# Patient Record
Sex: Female | Born: 1955 | Race: White | Hispanic: No | Marital: Single | Smoking: Never smoker
Health system: Southern US, Community
[De-identification: ages and names within clinical notes are randomized; demographics above are authoritative.]

## PROBLEM LIST (undated history)

## (undated) DIAGNOSIS — K219 Gastro-esophageal reflux disease without esophagitis: Secondary | ICD-10-CM

## (undated) HISTORY — PX: OTHER SURGICAL HISTORY: SHX169

## (undated) HISTORY — PX: OVARIAN CYST REMOVAL: SHX89

## (undated) HISTORY — PX: LAPAROSCOPY: SHX197

---

## 2015-05-16 ENCOUNTER — Other Ambulatory Visit: Payer: Self-pay

## 2015-05-16 ENCOUNTER — Other Ambulatory Visit: Payer: Self-pay | Admitting: Family Medicine

## 2015-05-16 DIAGNOSIS — R319 Hematuria, unspecified: Secondary | ICD-10-CM

## 2015-05-16 DIAGNOSIS — M545 Low back pain: Secondary | ICD-10-CM

## 2015-05-18 ENCOUNTER — Ambulatory Visit
Admission: RE | Admit: 2015-05-18 | Discharge: 2015-05-18 | Disposition: A | Discharge status: Home / Self Care | Payer: 59 | Source: Ambulatory Visit | Attending: Family Medicine | Admitting: Family Medicine

## 2015-05-18 DIAGNOSIS — R319 Hematuria, unspecified: Secondary | ICD-10-CM

## 2015-05-18 DIAGNOSIS — M545 Low back pain: Secondary | ICD-10-CM

## 2015-09-08 ENCOUNTER — Inpatient Hospital Stay (HOSPITAL_COMMUNITY): Payer: 59

## 2015-09-08 ENCOUNTER — Encounter (HOSPITAL_COMMUNITY): Payer: Self-pay | Admitting: *Deleted

## 2015-09-08 ENCOUNTER — Inpatient Hospital Stay (HOSPITAL_COMMUNITY)
Admission: AD | Admit: 2015-09-08 | Discharge: 2015-09-10 | DRG: 699 | Disposition: A | Discharge status: Home / Self Care | Payer: 59 | Source: Ambulatory Visit | Attending: Obstetrics & Gynecology | Admitting: Obstetrics & Gynecology

## 2015-09-08 DIAGNOSIS — K219 Gastro-esophageal reflux disease without esophagitis: Secondary | ICD-10-CM | POA: Diagnosis present

## 2015-09-08 DIAGNOSIS — S3720XA Unspecified injury of bladder, initial encounter: Secondary | ICD-10-CM | POA: Diagnosis present

## 2015-09-08 DIAGNOSIS — L0390 Cellulitis, unspecified: Secondary | ICD-10-CM | POA: Diagnosis present

## 2015-09-08 DIAGNOSIS — N9971 Accidental puncture and laceration of a genitourinary system organ or structure during a genitourinary system procedure: Secondary | ICD-10-CM | POA: Diagnosis present

## 2015-09-08 DIAGNOSIS — S3729XA Other injury of bladder, initial encounter: Principal | ICD-10-CM | POA: Diagnosis present

## 2015-09-08 DIAGNOSIS — Z87898 Personal history of other specified conditions: Secondary | ICD-10-CM

## 2015-09-08 DIAGNOSIS — R319 Hematuria, unspecified: Secondary | ICD-10-CM

## 2015-09-08 DIAGNOSIS — G8918 Other acute postprocedural pain: Secondary | ICD-10-CM

## 2015-09-08 HISTORY — DX: Gastro-esophageal reflux disease without esophagitis: K21.9

## 2015-09-08 LAB — COMPREHENSIVE METABOLIC PANEL
ALBUMIN: 4.6 g/dL (ref 3.5–5.0)
ALBUMIN: 3.2 g/dL — AB (ref 3.5–5.0)
ALT: 22 U/L (ref 14–54)
ALT: 18 U/L (ref 14–54)
ANION GAP: 11 (ref 5–15)
AST: 29 U/L (ref 15–41)
AST: 24 U/L (ref 15–41)
Alkaline Phosphatase: 95 U/L (ref 38–126)
Alkaline Phosphatase: 69 U/L (ref 38–126)
Anion gap: 21 — ABNORMAL HIGH (ref 5–15)
BILIRUBIN TOTAL: 1.1 mg/dL (ref 0.3–1.2)
BUN: 86 mg/dL — AB (ref 6–20)
BUN: 49 mg/dL — ABNORMAL HIGH (ref 6–20)
CALCIUM: 7.4 mg/dL — AB (ref 8.9–10.3)
CHLORIDE: 88 mmol/L — AB (ref 101–111)
CO2: 11 mmol/L — AB (ref 22–32)
CO2: 15 mmol/L — AB (ref 22–32)
Calcium: 7.1 mg/dL — ABNORMAL LOW (ref 8.9–10.3)
Chloride: 108 mmol/L (ref 101–111)
Creatinine, Ser: 9.49 mg/dL — ABNORMAL HIGH (ref 0.44–1.00)
Creatinine, Ser: 2.98 mg/dL — ABNORMAL HIGH (ref 0.44–1.00)
GFR calc Af Amer: 5 mL/min — ABNORMAL LOW (ref >60)
GFR calc non Af Amer: 16 mL/min — ABNORMAL LOW (ref >60)
GFR, EST AFRICAN AMERICAN: 19 mL/min — AB (ref >60)
GFR, EST NON AFRICAN AMERICAN: 4 mL/min — AB (ref >60)
GLUCOSE: 116 mg/dL — AB (ref 65–99)
GLUCOSE: 144 mg/dL — AB (ref 65–99)
POTASSIUM: 4.7 mmol/L (ref 3.5–5.1)
POTASSIUM: 3.1 mmol/L — AB (ref 3.5–5.1)
SODIUM: 120 mmol/L — AB (ref 135–145)
SODIUM: 134 mmol/L — AB (ref 135–145)
TOTAL PROTEIN: 5.9 g/dL — AB (ref 6.5–8.1)
Total Bilirubin: 0.7 mg/dL (ref 0.3–1.2)
Total Protein: 8.7 g/dL — ABNORMAL HIGH (ref 6.5–8.1)

## 2015-09-08 LAB — CBC
HCT: 39.9 % (ref 36.0–46.0)
Hemoglobin: 14.5 g/dL (ref 12.0–15.0)
MCH: 30.3 pg (ref 26.0–34.0)
MCHC: 36.3 g/dL — ABNORMAL HIGH (ref 30.0–36.0)
MCV: 83.5 fL (ref 78.0–100.0)
PLATELETS: 437 10*3/uL — AB (ref 150–400)
RBC: 4.78 MIL/uL (ref 3.87–5.11)
RDW: 12.7 % (ref 11.5–15.5)
WBC: 10.9 10*3/uL — AB (ref 4.0–10.5)

## 2015-09-08 MED ORDER — SODIUM CHLORIDE 0.9 % IV SOLN
3.0000 g | Freq: Two times a day (BID) | INTRAVENOUS | Status: DC
  Administered 2015-09-09: 3 g via INTRAVENOUS
  Filled 2015-09-08 (×2): qty 3

## 2015-09-08 MED ORDER — SODIUM CHLORIDE 0.9 % IV SOLN
3.0000 g | Freq: Four times a day (QID) | INTRAVENOUS | Status: DC
  Administered 2015-09-08: 3 g via INTRAVENOUS
  Filled 2015-09-08 (×3): qty 3

## 2015-09-08 MED ORDER — IOHEXOL 300 MG/ML  SOLN
50.0000 mL | Freq: Once | INTRAMUSCULAR | Status: AC | PRN
  Administered 2015-09-08: 50 mL via URETHRAL

## 2015-09-08 MED ORDER — SODIUM CHLORIDE 0.9 % IV SOLN
INTRAVENOUS | Status: DC
  Administered 2015-09-08: 12:00:00 via INTRAVENOUS

## 2015-09-08 MED ORDER — SODIUM CHLORIDE 0.9 % IV SOLN
INTRAVENOUS | Status: DC
  Administered 2015-09-08 – 2015-09-09 (×4): via INTRAVENOUS

## 2015-09-08 NOTE — Consult Note (Signed)
Consult: Extraperitoneal bladder perforation Requested by: Dr. 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 Per Dr. Vincente Poli, the dissection was straight forward but specimen extraction was a bit difficult and there was suspicion of a bladder injury. The patient noted progressively more fluid coming from her umbilical and lower abdominal incisions as the week went by and less urine output. A BUN and creatinine were checked earlier today and noted to be significantly elevated at 86 and 9.49. She underwent a CT abdomen and pelvis without contrast which showed normal kidneys. I was able to trace both ureters to the bladder. A CT cystogram revealed a small (6-7 mm) extraperitoneal bladder perforation with extravasation of contrast toward the subcutaneous tissues and incisions. There was also a small amount of ascites in the abdomen. I reviewed the images.  Currently, pt feeling better. Happy to get something to eat. She has some erythema of the mons and is on Unasyn.   Past Medical History  Diagnosis Date  . GERD (gastroesophageal reflux disease)    Past Surgical History  Procedure Laterality Date  . Ovarian cyst removal    . Left salpingoophorectomy     . Laparoscopy      Home Medications:  Prescriptions prior to admission  Medication Sig Dispense Refill Last Dose  . acetaminophen (TYLENOL) 500 MG tablet Take 1,000 mg by mouth every 6 (six) hours as needed for mild pain or moderate pain.   09/08/2015 at Unknown time   Allergies:  Allergies  Allergen Reactions  . Aspirin Hives    Family History  Problem Relation Age of Onset  . Heart disease Father    Social History:  reports that she has never smoked. She does not have any smokeless tobacco history on file. She reports that she does not drink alcohol or use illicit drugs.  ROS: A complete review of systems was performed.  All systems are negative  except for pertinent findings as noted. ROS   Physical Exam:  Vital signs in last 24 hours: Temp:  [97.5 F (36.4 C)] 97.5 F (36.4 C) (02/23 1530) Pulse Rate:  [86] 86 (02/23 1530) Resp:  [18] 18 (02/23 1530) BP: (136)/(80) 136/80 mmHg (02/23 1530) SpO2:  [100 %] 100 % (02/23 1530) Weight:  [66.679 kg (147 lb)] 66.679 kg (147 lb) (02/23 1530)    Intake/Output Summary (Last 24 hours) at 09/08/15 1800 Last data filed at 09/08/15 1748  Gross per 24 hour  Intake 1157.5 ml  Output   3800 ml  Net -2642.5 ml  another 1 L in urine bag now.   General:  Alert and oriented, No acute distress. Nurse was chaperone. Pt sitting on edge of bed eating.  HEENT: Normocephalic, atraumatic Lungs: Regular rate and effort Abdomen: Soft, nontender, nondistended, no abdominal masses. Some erythema of mons.  Back: No CVA tenderness Extremities: No edema Neurologic: Grossly intact Ext: SCD's in place   Laboratory Data:  Results for orders placed or performed during the hospital encounter of 09/08/15 (from the past 24 hour(s))  CBC     Status: Abnormal   Collection Time: 09/08/15 11:16 AM  Result Value Ref Range   WBC 10.9 (H) 4.0 - 10.5 K/uL   RBC 4.78 3.87 - 5.11 MIL/uL   Hemoglobin 14.5 12.0 - 15.0 g/dL   HCT 40.9 81.1 - 91.4 %   MCV 83.5 78.0 - 100.0 fL   MCH 30.3 26.0 - 34.0 pg  MCHC 36.3 (H) 30.0 - 36.0 g/dL   RDW 40.9 81.1 - 91.4 %   Platelets 437 (H) 150 - 400 K/uL  Comprehensive metabolic panel     Status: Abnormal   Collection Time: 09/08/15 11:16 AM  Result Value Ref Range   Sodium 120 (L) 135 - 145 mmol/L   Potassium 4.7 3.5 - 5.1 mmol/L   Chloride 88 (L) 101 - 111 mmol/L   CO2 11 (L) 22 - 32 mmol/L   Glucose, Bld 116 (H) 65 - 99 mg/dL   BUN 86 (H) 6 - 20 mg/dL   Creatinine, Ser 7.82 (H) 0.44 - 1.00 mg/dL   Calcium 7.1 (L) 8.9 - 10.3 mg/dL   Total Protein 8.7 (H) 6.5 - 8.1 g/dL   Albumin 4.6 3.5 - 5.0 g/dL   AST 29 15 - 41 U/L   ALT 22 14 - 54 U/L   Alkaline  Phosphatase 95 38 - 126 U/L   Total Bilirubin 0.7 0.3 - 1.2 mg/dL   GFR calc non Af Amer 4 (L) >60 mL/min   GFR calc Af Amer 5 (L) >60 mL/min   Anion gap 21 (H) 5 - 15   No results found for this or any previous visit (from the past 240 hour(s)). Creatinine:  Recent Labs  09/08/15 1116  CREATININE 9.49*     Impression/Assessment/plan: -extraperitoneal bladder perforation-excellent urine output after Foley catheter placed. Now what about 5 L. I suspect she will diurese and do well with Foley catheter drainage. She has a 14Fr foley. It is draining well, but if there are any issues will need to be changed to at least 16Fr. There was no specific fluid collection in the subcutaneous tissues, so hopefully the extravasated urine seen on CT will resorb. Do not suspect ureteral injury given appearance of the kidneys and ureters on the CT, description of operative findings by Dr. Vincente Poli, and nature of pt presentation, but will follow Cr and UOP. Patient on broad-spectrum antibiotics. Follow cellulitis. Follow daily BUN and creatinine. Would ultimately send home on a nightly antibiotic such as cephalexin or Bactrim DS with foley for at least 2 weeks and f/u for cystogram in my office.  Sena Clouatre 09/08/2015, 5:55 PM

## 2015-09-08 NOTE — Progress Notes (Signed)
ABD pads changed on incisions, ice applied to area

## 2015-09-08 NOTE — H&P (Signed)
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 Post op course complicated by drainage of fluid from umbilicus starting on POD #3.  She was seen on Monday in the office and diagnosed with possible seroma. She was given Keflex and advised to follow up on Wednesday. I saw her on Wednesday and she reported moderate amount of drainage from her "belly button". She reported some diarrhea. She also reported she was eating and drinking very little. Denied nausea, vomiting, fever or chills. Denied hematuria on Wednesday.  I placed iodoform gauze in her umbilical incision and advised her to come to office today. Today, she reports feeling worse.  Drainage still persistent. She reports she is urinating but small amounts.  I had her urinate in the office and there is gross hematuria which she has not noticed until now.  Given my concern for urinary tract injury, I sent her to [facility] for evaluation.  Past Medical History: Unremarkable  Past Surgical History: Above  Meds: Keflex  Allergies NKDA  General alert and oriented appears to be in some distress Lung CTAB Car RRR Abdomen slightly distended, tender along left flank No CVAT Umbilical incision - packing removed and less serosanguinous fluid noted today but still some drainage.  Low transverse incision - with probing with Qtip I can get slightly bloodier / watery drainage  Mons pubis is swollen and erythematous Results for orders placed or performed during the hospital encounter of 09/08/15 (from the past 24 hour(s))  CBC     Status: Abnormal   Collection Time: 09/08/15 11:16 AM  Result Value Ref Range   WBC 10.9 (H) 4.0 - 10.5 K/uL   RBC 4.78 3.87 - 5.11 MIL/uL   Hemoglobin 14.5 12.0 - 15.0 g/dL   HCT 16.1 09.6 - 04.5 %   MCV 83.5 78.0 - 100.0 fL   MCH 30.3 26.0 - 34.0 pg   MCHC 36.3 (H) 30.0 - 36.0 g/dL   RDW 40.9 81.1 - 91.4 %   Platelets 437 (H) 150 - 400 K/uL  Comprehensive  metabolic panel     Status: Abnormal   Collection Time: 09/08/15 11:16 AM  Result Value Ref Range   Sodium 120 (L) 135 - 145 mmol/L   Potassium 4.7 3.5 - 5.1 mmol/L   Chloride 88 (L) 101 - 111 mmol/L   CO2 11 (L) 22 - 32 mmol/L   Glucose, Bld 116 (H) 65 - 99 mg/dL   BUN 86 (H) 6 - 20 mg/dL   Creatinine, Ser 7.82 (H) 0.44 - 1.00 mg/dL   Calcium 7.1 (L) 8.9 - 10.3 mg/dL   Total Protein 8.7 (H) 6.5 - 8.1 g/dL   Albumin 4.6 3.5 - 5.0 g/dL   AST 29 15 - 41 U/L   ALT 22 14 - 54 U/L   Alkaline Phosphatase 95 38 - 126 U/L   Total Bilirubin 0.7 0.3 - 1.2 mg/dL   GFR calc non Af Amer 4 (L) >60 mL/min   GFR calc Af Amer 5 (L) >60 mL/min   Anion gap 21 (H) 5 - 15    Impression:  POD #6 Probable Lower Urinary Tract Injury  PLAN: Consulted with Dr. Mena Goes - Urology Recommend CT Cystogramm and CT Abd and Pelvis with IV contrast and delayed imaging IV Fluids Insert Foley now Discussed with patient and her brother possibility of bladder defect and wait for imaging results and urology recommendations All questions answered

## 2015-09-08 NOTE — MAU Provider Note (Signed)
History     CSN: [id number]  Arrival date and time: 09/08/15 1057   First Provider Initiated Contact with Patient 09/08/15 1153      Chief Complaint  Patient presents with  . Hematuria  . Post-op Problem   HPI Ms. Laura Mays is a 60 y.o. female who is 6 day post-op after a left salpingectomy, oophorectomy and removal of a large dermoid cyst presents to MAU today with complaint of hematuria. The patient was sent by Dr. Thana Ates from the office for further evaluation of a possible post operative complication involving the bladder. The patient was unaware of hematuria, but had hematuria on UA in the office. She states that she has had significant clear drainage from her incision sites. She complains of LLQ abdominal pain. She was taking Percocet post-operatively for pain, but states that it caused hallucinations. She also feels that Ibuprofen causes GI upset, so she is currently taking Tylenol only for pain. She denies fever.    OB History    Gravida Para Term Preterm AB TAB SAB Ectopic Multiple Living        Past Medical History  Diagnosis Date  . GERD (gastroesophageal reflux disease)     Past Surgical History  Procedure Laterality Date  . Ovarian cyst removal      No family history on file.  Social History  Substance Use Topics  . Smoking status: Never Smoker   . Smokeless tobacco: Not on file  . Alcohol Use: No    Allergies:  Allergies  Allergen Reactions  . Aspirin Hives    Prescriptions prior to admission  Medication Sig Dispense Refill Last Dose  . acetaminophen (TYLENOL) 500 MG tablet Take 1,000 mg by mouth every 6 (six) hours as needed for mild pain or moderate pain.   09/08/2015 at Unknown time    Review of Systems  Constitutional: Negative for fever and malaise/fatigue.  Gastrointestinal: Positive for nausea and abdominal pain. Negative for vomiting, diarrhea and constipation.  Genitourinary: Positive for hematuria. Negative for  dysuria.   Physical Exam   There were no vitals taken for this visit.  Physical Exam  Nursing note and vitals reviewed. Constitutional: She is oriented to person, place, and time. She appears well-developed and well-nourished. No distress.  HENT:  Head: Normocephalic and atraumatic.  Cardiovascular: Normal rate.   Respiratory: Effort normal.  GI: Soft. She exhibits no distension and no mass. There is no tenderness. There is no rebound and no guarding.  Incision are healing, but continue to drain clear fluid  Neurological: She is alert and oriented to person, place, and time.  Skin: Skin is warm and dry. There is erythema (significant erythema noted over the mons pubis and lower abdomen).  Psychiatric: She has a normal mood and affect.    Results for orders placed or performed during the hospital encounter of 09/08/15 (from the past 24 hour(s))  CBC     Status: Abnormal   Collection Time: 09/08/15 11:16 AM  Result Value Ref Range   WBC 10.9 (H) 4.0 - 10.5 K/uL   RBC 4.78 3.87 - 5.11 MIL/uL   Hemoglobin 14.5 12.0 - 15.0 g/dL   HCT 65.7 84.6 - 96.2 %   MCV 83.5 78.0 - 100.0 fL   MCH 30.3 26.0 - 34.0 pg   MCHC 36.3 (H) 30.0 - 36.0 g/dL   RDW 95.2 84.1 - 32.4 %   Platelets 437 (H) 150 - 400 K/uL  Comprehensive metabolic panel     Status: Abnormal   Collection Time: 09/08/15 11:16 AM  Result Value Ref Range   Sodium 120 (L) 135 - 145 mmol/L   Potassium 4.7 3.5 - 5.1 mmol/L   Chloride 88 (L) 101 - 111 mmol/L   CO2 11 (L) 22 - 32 mmol/L   Glucose, Bld 116 (H) 65 - 99 mg/dL   BUN 86 (H) 6 - 20 mg/dL   Creatinine, Ser 1.61 (H) 0.44 - 1.00 mg/dL   Calcium 7.1 (L) 8.9 - 10.3 mg/dL   Total Protein 8.7 (H) 6.5 - 8.1 g/dL   Albumin 4.6 3.5 - 5.0 g/dL   AST 29 15 - 41 U/L   ALT 22 14 - 54 U/L   Alkaline Phosphatase 95 38 - 126 U/L   Total Bilirubin 0.7 0.3 - 1.2 mg/dL   GFR calc non Af Amer 4 (L) >60 mL/min   GFR calc Af Amer 5 (L) >60 mL/min   Anion gap 21 (H) 5 - 15   No  results found.   MAU Course  Procedures None  MDM Dr. Thana Ates called ahead of patient arrival after seeing patient in her office Requests CBC, CMP, catheter placement, CT abdomen/pelvis and Cystogram as well as IV fluid access Catheter placed and hematuria noted. Patient refuses anti-emetics at this time CMP shows evidence of acute renal failure. Dr. Thana Ates has consulted urology. Urologist still advises CT abdomen/pelvis without contrast and cystogram.  Dr. Thana Ates has been to MAU to evaluate the patient. She states that the Urologist will also come to evaluate the patient.  Patient's urine is clearer now and she reports improvement in pain Discussed CT results with Dr. Thana Ates. She has consulted with Urology. Admit patient, Unasyn, clear liquids, repeat CBC, CMP in the morning. Urology will come see the patient on the floor.  Assessment and Plan  A: Post operative complication Hematuria Bladder injury   P: Admit to [facility] Continue IV fluids and foley catheter Unasyn IV   Marny Lowenstein, PA-C  09/08/2015, 1:53 PM

## 2015-09-09 LAB — COMPREHENSIVE METABOLIC PANEL
ALT: 15 U/L (ref 14–54)
ANION GAP: 7 (ref 5–15)
AST: 22 U/L (ref 15–41)
Albumin: 3.1 g/dL — ABNORMAL LOW (ref 3.5–5.0)
Alkaline Phosphatase: 64 U/L (ref 38–126)
BILIRUBIN TOTAL: 1 mg/dL (ref 0.3–1.2)
BUN: 24 mg/dL — AB (ref 6–20)
CALCIUM: 8.1 mg/dL — AB (ref 8.9–10.3)
CHLORIDE: 112 mmol/L — AB (ref 101–111)
CO2: 19 mmol/L — ABNORMAL LOW (ref 22–32)
Creatinine, Ser: 0.96 mg/dL (ref 0.44–1.00)
GFR calc Af Amer: >60 mL/min (ref >60)
Glucose, Bld: 114 mg/dL — ABNORMAL HIGH (ref 65–99)
POTASSIUM: 3.2 mmol/L — AB (ref 3.5–5.1)
Sodium: 138 mmol/L (ref 135–145)
Total Protein: 6.1 g/dL — ABNORMAL LOW (ref 6.5–8.1)

## 2015-09-09 LAB — CBC WITH DIFFERENTIAL/PLATELET
BASOS PCT: 0 %
Basophils Absolute: 0 10*3/uL (ref 0.0–0.1)
EOS PCT: 0 %
Eosinophils Absolute: 0 10*3/uL (ref 0.0–0.7)
HEMATOCRIT: 32.1 % — AB (ref 36.0–46.0)
Hemoglobin: 11.4 g/dL — ABNORMAL LOW (ref 12.0–15.0)
LYMPHS PCT: 25 %
Lymphs Abs: 2 10*3/uL (ref 0.7–4.0)
MCH: 29.8 pg (ref 26.0–34.0)
MCHC: 35.5 g/dL (ref 30.0–36.0)
MCV: 84 fL (ref 78.0–100.0)
MONO ABS: 0.4 10*3/uL (ref 0.1–1.0)
MONOS PCT: 5 %
NEUTROS ABS: 5.6 10*3/uL (ref 1.7–7.7)
Neutrophils Relative %: 70 %
PLATELETS: 352 10*3/uL (ref 150–400)
RBC: 3.82 MIL/uL — ABNORMAL LOW (ref 3.87–5.11)
RDW: 13 % (ref 11.5–15.5)
WBC: 8 10*3/uL (ref 4.0–10.5)

## 2015-09-09 MED ORDER — SODIUM CHLORIDE 0.9 % IV SOLN
3.0000 g | Freq: Four times a day (QID) | INTRAVENOUS | Status: DC
  Administered 2015-09-09 – 2015-09-10 (×4): 3 g via INTRAVENOUS
  Filled 2015-09-09 (×6): qty 3

## 2015-09-09 MED ORDER — GUAIFENESIN ER 600 MG PO TB12
600.0000 mg | ORAL_TABLET | Freq: Two times a day (BID) | ORAL | Status: DC | PRN
  Filled 2015-09-09: qty 1

## 2015-09-09 NOTE — Progress Notes (Signed)
This morning, Patient looks great !. Denies any pain.  Reports good appetite. Ate 80% regular breakfast. Catheter draining over 6 L since yesterday.  BP 113/72 mmHg  Pulse 78  Temp(Src) 98.6 F (37 C) (Oral)  Resp 18  Ht  (1.499 m)  Wt 66.679 kg (147 lb)  BMI 29.67 kg/m2  SpO2 99% Results for orders placed or performed during the hospital encounter of 09/08/15 (from the past 24 hour(s))  CBC     Status: Abnormal   Collection Time: 09/08/15 11:16 AM  Result Value Ref Range   WBC 10.9 (H) 4.0 - 10.5 K/uL   RBC 4.78 3.87 - 5.11 MIL/uL   Hemoglobin 14.5 12.0 - 15.0 g/dL   HCT 16.1 09.6 - 04.5 %   MCV 83.5 78.0 - 100.0 fL   MCH 30.3 26.0 - 34.0 pg   MCHC 36.3 (H) 30.0 - 36.0 g/dL   RDW 40.9 81.1 - 91.4 %   Platelets 437 (H) 150 - 400 K/uL  Comprehensive metabolic panel     Status: Abnormal   Collection Time: 09/08/15 11:16 AM  Result Value Ref Range   Sodium 120 (L) 135 - 145 mmol/L   Potassium 4.7 3.5 - 5.1 mmol/L   Chloride 88 (L) 101 - 111 mmol/L   CO2 11 (L) 22 - 32 mmol/L   Glucose, Bld 116 (H) 65 - 99 mg/dL   BUN 86 (H) 6 - 20 mg/dL   Creatinine, Ser 7.82 (H) 0.44 - 1.00 mg/dL   Calcium 7.1 (L) 8.9 - 10.3 mg/dL   Total Protein 8.7 (H) 6.5 - 8.1 g/dL   Albumin 4.6 3.5 - 5.0 g/dL   AST 29 15 - 41 U/L   ALT 22 14 - 54 U/L   Alkaline Phosphatase 95 38 - 126 U/L   Total Bilirubin 0.7 0.3 - 1.2 mg/dL   GFR calc non Af Amer 4 (L) >60 mL/min   GFR calc Af Amer 5 (L) >60 mL/min   Anion gap 21 (H) 5 - 15  Comprehensive metabolic panel     Status: Abnormal   Collection Time: 09/08/15  8:34 PM  Result Value Ref Range   Sodium 134 (L) 135 - 145 mmol/L   Potassium 3.1 (L) 3.5 - 5.1 mmol/L   Chloride 108 101 - 111 mmol/L   CO2 15 (L) 22 - 32 mmol/L   Glucose, Bld 144 (H) 65 - 99 mg/dL   BUN 49 (H) 6 - 20 mg/dL   Creatinine, Ser 9.56 (H) 0.44 - 1.00 mg/dL   Calcium 7.4 (L) 8.9 - 10.3 mg/dL   Total Protein 5.9 (L) 6.5 - 8.1 g/dL   Albumin 3.2 (L) 3.5 - 5.0 g/dL   AST  24 15 - 41 U/L   ALT 18 14 - 54 U/L   Alkaline Phosphatase 69 38 - 126 U/L   Total Bilirubin 1.1 0.3 - 1.2 mg/dL   GFR calc non Af Amer 16 (L) >60 mL/min   GFR calc Af Amer 19 (L) >60 mL/min   Anion gap 11 5 - 15  Comprehensive metabolic panel     Status: Abnormal   Collection Time: 09/09/15  5:39 AM  Result Value Ref Range   Sodium 138 135 - 145 mmol/L   Potassium 3.2 (L) 3.5 - 5.1 mmol/L   Chloride 112 (H) 101 - 111 mmol/L   CO2 19 (L) 22 - 32 mmol/L   Glucose, Bld 114 (H) 65 - 99 mg/dL   BUN 24 (  H) 6 - 20 mg/dL   Creatinine, Ser 1.61 0.44 - 1.00 mg/dL   Calcium 8.1 (L) 8.9 - 10.3 mg/dL   Total Protein 6.1 (L) 6.5 - 8.1 g/dL   Albumin 3.1 (L) 3.5 - 5.0 g/dL   AST 22 15 - 41 U/L   ALT 15 14 - 54 U/L   Alkaline Phosphatase 64 38 - 126 U/L   Total Bilirubin 1.0 0.3 - 1.2 mg/dL   GFR calc non Af Amer >60 >60 mL/min   GFR calc Af Amer >60 >60 mL/min   Anion gap 7 5 - 15  CBC WITH DIFFERENTIAL     Status: Abnormal   Collection Time: 09/09/15  5:39 AM  Result Value Ref Range   WBC 8.0 4.0 - 10.5 K/uL   RBC 3.82 (L) 3.87 - 5.11 MIL/uL   Hemoglobin 11.4 (L) 12.0 - 15.0 g/dL   HCT 09.6 (L) 04.5 - 40.9 %   MCV 84.0 78.0 - 100.0 fL   MCH 29.8 26.0 - 34.0 pg   MCHC 35.5 30.0 - 36.0 g/dL   RDW 81.1 91.4 - 78.2 %   Platelets 352 150 - 400 K/uL   Neutrophils Relative % 70 %   Neutro Abs 5.6 1.7 - 7.7 K/uL   Lymphocytes Relative 25 %   Lymphs Abs 2.0 0.7 - 4.0 K/uL   Monocytes Relative 5 %   Monocytes Absolute 0.4 0.1 - 1.0 K/uL   Eosinophils Relative 0 %   Eosinophils Absolute 0.0 0.0 - 0.7 K/uL   Basophils Relative 0 %   Basophils Absolute 0.0 0.0 - 0.1 K/uL   Abdomen  Non distended today. No drainage from incisions . Closed infraumbilical incision with Dermabond. Erythema on mons is still present but decreased and soft.  IMPRESSION: POD #7 Bladder rupture  PLAN:: Patient doing well. Continue Foley Continue Unasyn Catheter in for 2 weeks Appreciate Urology  consult

## 2015-09-09 NOTE — Clinical Documentation Improvement (Signed)
GYN and/or Urology  (Query responses must be documented in the current medical record, not on the CDI BPA form in [facility].)  Please document if a condition below provides greater specificity regarding the lab value of Na+ 120 mmol/L.  Possible Clinical Conditions:  - Hyponatremia  - Other condition  - Unable to clinically determine  Clinical Information: Patient has received IV NS at 125 ml/hr this admission Na+ trend this admission Component     Latest Ref Rng 09/08/2015 09/08/2015 09/09/2015        11:16 AM  8:34 PM   Sodium     135 - 145 mmol/L 120 (L) 134 (L) 138     Please exercise your independent, professional judgment when responding. A specific answer is not anticipated or expected.   Thank You, Jerral Ralph  RN BSN CCDS [phone] Health Information Management [place]

## 2015-09-09 NOTE — Discharge Instructions (Signed)
Foley Catheter Care, Adult    A Foley catheter is a soft, flexible tube. This tube is placed into your bladder to drain pee (urine). If you go home with this catheter in place, follow the instructions below.  TAKING CARE OF THE CATHETER  1. Wash your hands with soap and water.  2. Put soap and water on a clean washcloth.  ¨ Clean the skin where the tube goes into your body.  § Clean away from the tube site.  § Never wipe toward the tube.  § Clean the area using a circular motion.  ¨ Remove all the soap. Pat the area dry with a clean towel. For males, reposition the skin that covers the end of the penis (foreskin).  3. Attach the tube to your leg with tape or a leg strap. Do not stretch the tube tight. If you are using tape, remove any stickiness left behind by past tape you used.  4. Keep the drainage bag below your hips. Keep it off the floor.  5. Check your tube during the day. Make sure it is working and draining. Make sure the tube does not curl, twist, or bend.  6. Do not pull on the tube or try to take it out.  TAKING CARE OF THE DRAINAGE BAGS    You will have a large overnight drainage bag and a small leg bag. You may wear the overnight bag any time. Never wear the small bag at night. Follow the directions below.  Emptying the Drainage Bag  Empty your drainage bag when it is ?-½ full or at least 2-3 times a day.  1. Wash your hands with soap and water.  2. Keep the drainage bag below your hips.  3. Hold the dirty bag over the toilet or clean container.  4. Open the pour spout at the bottom of the bag. Empty the pee into the toilet or container. Do not let the pour spout touch anything.  5. Clean the pour spout with a gauze pad or cotton ball that has rubbing alcohol on it.  6. Close the pour spout.  7. Attach the bag to your leg with tape or a leg strap.  8. Wash your hands well.  Changing the Drainage Bag  Change your bag once a month or sooner if it starts to smell or look dirty.   1. Wash your hands with  soap and water.  2. Pinch the rubber tube so that pee does not spill out.  3. Disconnect the catheter tube from the drainage tube at the connection valve. Do not let the tubes touch anything.  4. Clean the end of the catheter tube with an alcohol wipe. Clean the end of a the drainage tube with a different alcohol wipe.  5. Connect the catheter tube to the drainage tube of the clean drainage bag.  6. Attach the new bag to the leg with tape or a leg strap. Avoid attaching the new bag too tightly.  7. Wash your hands well.  Cleaning the Drainage Bag  1. Wash your hands with soap and water.  2. Wash the bag in warm, soapy water.  3. Rinse the bag with warm water.  4. Fill the bag with a mixture of white vinegar and water (1 cup vinegar to 1 quart warm water [.2 liter vinegar to 1 liter warm water]). Close the bag and soak it for 30 minutes in the solution.  5. Rinse the bag with warm water.  6.   Hang the bag to dry with the pour spout open and hanging downward.  7. Store the clean bag (once it is dry) in a clean plastic bag.  8. Wash your hands well.  PREVENT INFECTION  · Wash your hands before and after touching your tube.  · Take showers every day. Wash the skin where the tube enters your body. Do not take baths. Replace wet leg straps with dry ones, if this applies.  · Do not use powders, sprays, or lotions on the genital area. Only use creams, lotions, or ointments as told by your doctor.  · For females, wipe from front to back after going to the bathroom.  · Drink enough fluids to keep your pee clear or pale yellow unless you are told not to have too much fluid (fluid restriction).  · Do not let the drainage bag or tubing touch or lie on the floor.  · Wear cotton underwear to keep the area dry.  GET HELP IF:  · Your pee is cloudy or smells unusually bad.  · Your tube becomes clogged.  · You are not draining pee into the bag or your bladder feels full.  · Your tube starts to leak.  GET HELP RIGHT AWAY IF:  · You have  pain, puffiness (swelling), redness, or yellowish-white fluid (pus) where the tube enters the body.  · You have pain in the belly (abdomen), legs, lower back, or bladder.  · You have a fever.  · You see blood fill the tube, or your pee is pink or red.  · You feel sick to your stomach (nauseous), throw up (vomit), or have chills.  · Your tube gets pulled out.  MAKE SURE YOU:   · Understand these instructions.  · Will watch your condition.  · Will get help right away if you are not doing well or get worse.     This information is not intended to replace advice given to you by your health care provider. Make sure you discuss any questions you have with your health care provider.     Document Released: 10/27/2012 Document Revised: 07/23/2014 Document Reviewed: 10/27/2012  Elsevier Interactive Patient Education ©2016 [employer]   

## 2015-09-09 NOTE — Progress Notes (Signed)
   Patient reports she feels much better.  She had a brisk diuresis with return of normal BUN and creatinine.  Filed Vitals:   09/09/15 0504 09/09/15 1000  BP: 113/72 111/71  Pulse: 78 81  Temp: 98.6 F (37 C) 98.5 F (36.9 C)  Resp: 18 18    Intake/Output Summary (Last 24 hours) at 09/09/15 1422 Last data filed at 09/09/15 1036  Gross per 24 hour  Intake 3712.5 ml  Output   4600 ml  Net -887.5 ml   NAD GU - foley in place, light hematuria - red urine in bag, more pink/light in tubing.   A/p - extraperitoneal bladder perforation-continue Foley catheter.  I will arrange a cystogram week of Mar 13. Keep on one nightly antibiotic such as cephalexin or Bactrim DS.  Gross hematuria-hematuria is very light without clots and should not be clinically significant.  If issue might need a bigger catheter.  Discussed with patient the importance of catheter drainage and if it is not draining to call office immediately.

## 2015-09-10 LAB — COMPREHENSIVE METABOLIC PANEL
ALK PHOS: 59 U/L (ref 38–126)
ALT: 17 U/L (ref 14–54)
ANION GAP: 7 (ref 5–15)
AST: 23 U/L (ref 15–41)
Albumin: 3 g/dL — ABNORMAL LOW (ref 3.5–5.0)
BILIRUBIN TOTAL: 0.9 mg/dL (ref 0.3–1.2)
BUN: 6 mg/dL (ref 6–20)
CALCIUM: 8.2 mg/dL — AB (ref 8.9–10.3)
CO2: 20 mmol/L — ABNORMAL LOW (ref 22–32)
CREATININE: 0.47 mg/dL (ref 0.44–1.00)
Chloride: 112 mmol/L — ABNORMAL HIGH (ref 101–111)
GFR calc Af Amer: >60 mL/min (ref >60)
Glucose, Bld: 103 mg/dL — ABNORMAL HIGH (ref 65–99)
POTASSIUM: 3 mmol/L — AB (ref 3.5–5.1)
Sodium: 139 mmol/L (ref 135–145)
TOTAL PROTEIN: 5.5 g/dL — AB (ref 6.5–8.1)

## 2015-09-10 LAB — CBC
HEMATOCRIT: 29.8 % — AB (ref 36.0–46.0)
Hemoglobin: 10.6 g/dL — ABNORMAL LOW (ref 12.0–15.0)
MCH: 29.7 pg (ref 26.0–34.0)
MCHC: 35.6 g/dL (ref 30.0–36.0)
MCV: 83.5 fL (ref 78.0–100.0)
Platelets: 363 10*3/uL (ref 150–400)
RBC: 3.57 MIL/uL — ABNORMAL LOW (ref 3.87–5.11)
RDW: 13.4 % (ref 11.5–15.5)
WBC: 8.7 10*3/uL (ref 4.0–10.5)

## 2015-09-10 MED ORDER — SULFAMETHOXAZOLE-TRIMETHOPRIM 400-80 MG PO TABS: 1.0000 | ORAL_TABLET | Freq: Every day | ORAL | Status: AC

## 2015-09-10 MED ORDER — NYSTATIN-TRIAMCINOLONE 100000-0.1 UNIT/GM-% EX OINT: 1.0000 "application " | TOPICAL_OINTMENT | Freq: Two times a day (BID) | CUTANEOUS | Status: AC

## 2015-09-10 NOTE — Discharge Summary (Signed)
Physician Discharge Summary  Patient ID: Laura Mays MRN: [id number] DOB/AGE: [birth year] 60 y.o.  Admit date: 09/08/2015 Discharge date: 09/10/2015  Admission Diagnoses:bladder injury  Discharge Diagnoses:  Active Problems:   Bladder injury   Discharged Condition: good  Hospital Course: patient admitted and placed on IV Unasyn, foley catheter. CT C/W extraperitoneal 88m bladder injury probably at surgery. Urology consultation obtained. BUN and creatinine rapidly normalized with catheter drainage of bladder. Patient feeling well. Abdomen soft and edema of mons pubis largely resolved. Discharged home with indwelling foley catheter to straight drain and Bactrim SS qd per urology recommendation. Will FU with urology for catheter removal in 2-3 weeks.  Consults: urology  Significant Diagnostic Studies: labs:  Results for orders placed or performed during the hospital encounter of 09/08/15 (from the past 72 hour(s))  CBC     Status: Abnormal   Collection Time: 09/08/15 11:16 AM  Result Value Ref Range   WBC 10.9 (H) 4.0 - 10.5 K/uL   RBC 4.78 3.87 - 5.11 MIL/uL   Hemoglobin 14.5 12.0 - 15.0 g/dL   HCT 39.9 36.0 - 46.0 %   MCV 83.5 78.0 - 100.0 fL   MCH 30.3 26.0 - 34.0 pg   MCHC 36.3 (H) 30.0 - 36.0 g/dL   RDW 12.7 11.5 - 15.5 %   Platelets 437 (H) 150 - 400 K/uL  Comprehensive metabolic panel     Status: Abnormal   Collection Time: 09/08/15 11:16 AM  Result Value Ref Range   Sodium 120 (L) 135 - 145 mmol/L    Comment: REPEATED TO VERIFY   Potassium 4.7 3.5 - 5.1 mmol/L   Chloride 88 (L) 101 - 111 mmol/L    Comment: REPEATED TO VERIFY   CO2 11 (L) 22 - 32 mmol/L    Comment: REPEATED TO VERIFY   Glucose, Bld 116 (H) 65 - 99 mg/dL   BUN 86 (H) 6 - 20 mg/dL   Creatinine, Ser 9.49 (H) 0.44 - 1.00 mg/dL   Calcium 7.1 (L) 8.9 - 10.3 mg/dL   Total Protein 8.7 (H) 6.5 - 8.1 g/dL   Albumin 4.6 3.5 - 5.0 g/dL   AST 29 15 - 41 U/L   ALT 22 14 - 54 U/L   Alkaline Phosphatase 95 38 -  126 U/L   Total Bilirubin 0.7 0.3 - 1.2 mg/dL   GFR calc non Af Amer 4 (L) >60 mL/min   GFR calc Af Amer 5 (L) >60 mL/min    Comment: (NOTE) The eGFR has been calculated using the CKD EPI equation. This calculation has not been validated in all clinical situations. eGFR's persistently <60 mL/min signify possible Chronic Kidney Disease.    Anion gap 21 (H) 5 - 15    Comment: REPEATED TO VERIFY  Comprehensive metabolic panel     Status: Abnormal   Collection Time: 09/08/15  8:34 PM  Result Value Ref Range   Sodium 134 (L) 135 - 145 mmol/L    Comment: REPEATED TO VERIFY DELTA CHECK NOTED    Potassium 3.1 (L) 3.5 - 5.1 mmol/L    Comment: REPEATED TO VERIFY DELTA CHECK NOTED    Chloride 108 101 - 111 mmol/L   CO2 15 (L) 22 - 32 mmol/L   Glucose, Bld 144 (H) 65 - 99 mg/dL   BUN 49 (H) 6 - 20 mg/dL   Creatinine, Ser 2.98 (H) 0.44 - 1.00 mg/dL    Comment: REPEATED TO VERIFY DELTA CHECK NOTED    Calcium 7.4 (L) 8.9 -  10.3 mg/dL   Total Protein 5.9 (L) 6.5 - 8.1 g/dL   Albumin 3.2 (L) 3.5 - 5.0 g/dL   AST 24 15 - 41 U/L   ALT 18 14 - 54 U/L   Alkaline Phosphatase 69 38 - 126 U/L   Total Bilirubin 1.1 0.3 - 1.2 mg/dL   GFR calc non Af Amer 16 (L) >60 mL/min   GFR calc Af Amer 19 (L) >60 mL/min    Comment: (NOTE) The eGFR has been calculated using the CKD EPI equation. This calculation has not been validated in all clinical situations. eGFR's persistently <60 mL/min signify possible Chronic Kidney Disease.    Anion gap 11 5 - 15  Comprehensive metabolic panel     Status: Abnormal   Collection Time: 09/09/15  5:39 AM  Result Value Ref Range   Sodium 138 135 - 145 mmol/L   Potassium 3.2 (L) 3.5 - 5.1 mmol/L   Chloride 112 (H) 101 - 111 mmol/L   CO2 19 (L) 22 - 32 mmol/L   Glucose, Bld 114 (H) 65 - 99 mg/dL   BUN 24 (H) 6 - 20 mg/dL   Creatinine, Ser 0.96 0.44 - 1.00 mg/dL    Comment: DELTA CHECK NOTED REPEATED TO VERIFY    Calcium 8.1 (L) 8.9 - 10.3 mg/dL   Total  Protein 6.1 (L) 6.5 - 8.1 g/dL   Albumin 3.1 (L) 3.5 - 5.0 g/dL   AST 22 15 - 41 U/L   ALT 15 14 - 54 U/L   Alkaline Phosphatase 64 38 - 126 U/L   Total Bilirubin 1.0 0.3 - 1.2 mg/dL   GFR calc non Af Amer >60 >60 mL/min   GFR calc Af Amer >60 >60 mL/min    Comment: (NOTE) The eGFR has been calculated using the CKD EPI equation. This calculation has not been validated in all clinical situations. eGFR's persistently <60 mL/min signify possible Chronic Kidney Disease.    Anion gap 7 5 - 15  CBC WITH DIFFERENTIAL     Status: Abnormal   Collection Time: 09/09/15  5:39 AM  Result Value Ref Range   WBC 8.0 4.0 - 10.5 K/uL   RBC 3.82 (L) 3.87 - 5.11 MIL/uL   Hemoglobin 11.4 (L) 12.0 - 15.0 g/dL    Comment: REPEATED TO VERIFY DELTA CHECK NOTED    HCT 32.1 (L) 36.0 - 46.0 %   MCV 84.0 78.0 - 100.0 fL   MCH 29.8 26.0 - 34.0 pg   MCHC 35.5 30.0 - 36.0 g/dL   RDW 13.0 11.5 - 15.5 %   Platelets 352 150 - 400 K/uL   Neutrophils Relative % 70 %   Neutro Abs 5.6 1.7 - 7.7 K/uL   Lymphocytes Relative 25 %   Lymphs Abs 2.0 0.7 - 4.0 K/uL   Monocytes Relative 5 %   Monocytes Absolute 0.4 0.1 - 1.0 K/uL   Eosinophils Relative 0 %   Eosinophils Absolute 0.0 0.0 - 0.7 K/uL   Basophils Relative 0 %   Basophils Absolute 0.0 0.0 - 0.1 K/uL  CBC     Status: Abnormal   Collection Time: 09/10/15  5:28 AM  Result Value Ref Range   WBC 8.7 4.0 - 10.5 K/uL   RBC 3.57 (L) 3.87 - 5.11 MIL/uL   Hemoglobin 10.6 (L) 12.0 - 15.0 g/dL   HCT 29.8 (L) 36.0 - 46.0 %   MCV 83.5 78.0 - 100.0 fL   MCH 29.7 26.0 - 34.0 pg  MCHC 35.6 30.0 - 36.0 g/dL   RDW 13.4 11.5 - 15.5 %   Platelets 363 150 - 400 K/uL  Comprehensive metabolic panel     Status: Abnormal   Collection Time: 09/10/15  5:28 AM  Result Value Ref Range   Sodium 139 135 - 145 mmol/L   Potassium 3.0 (L) 3.5 - 5.1 mmol/L   Chloride 112 (H) 101 - 111 mmol/L   CO2 20 (L) 22 - 32 mmol/L   Glucose, Bld 103 (H) 65 - 99 mg/dL   BUN 6 6 - 20 mg/dL    Creatinine, Ser 0.47 0.44 - 1.00 mg/dL   Calcium 8.2 (L) 8.9 - 10.3 mg/dL   Total Protein 5.5 (L) 6.5 - 8.1 g/dL   Albumin 3.0 (L) 3.5 - 5.0 g/dL   AST 23 15 - 41 U/L   ALT 17 14 - 54 U/L   Alkaline Phosphatase 59 38 - 126 U/L   Total Bilirubin 0.9 0.3 - 1.2 mg/dL   GFR calc non Af Amer >60 >60 mL/min   GFR calc Af Amer >60 >60 mL/min    Comment: (NOTE) The eGFR has been calculated using the CKD EPI equation. This calculation has not been validated in all clinical situations. eGFR's persistently <60 mL/min signify possible Chronic Kidney Disease.    Anion gap 7 5 - 15    Treatments: IV hydration and antibiotics: Unasyn  Discharge Exam: Blood pressure 135/83, pulse 78, temperature 98.8 F (37.1 C), temperature source Oral, resp. rate 18, height 4' 11"  (1.499 m), weight 147 lb (66.679 kg), SpO2 100 %. General appearance: alert, cooperative and no distress GI: soft, non-tender; bowel sounds normal; no masses,  no organomegaly  Disposition: Final discharge disposition not confirmed     Medication List    TAKE these medications        acetaminophen 500 MG tablet  Commonly known as:  TYLENOL  Take 1,000 mg by mouth every 6 (six) hours as needed for mild pain or moderate pain.     nystatin-triamcinolone ointment  Commonly known as:  MYCOLOG  Apply 1 application topically 2 (two) times daily.     sulfamethoxazole-trimethoprim 400-80 MG tablet  Commonly known as:  BACTRIM  Take 1 tablet by mouth daily.           Follow-up Information    Follow up with ESKRIDGE, MATTHEW, MD In 2 weeks.   Specialty:  Urology   Why:  Office will call with appointment   Contact information:   [place] [address] [phone]       Signed: Allena Katz 09/10/2015, 9:37 AM

## 2015-09-10 NOTE — Progress Notes (Signed)
Patient ID: Laura Mays, female   DOB: 12/08/1955, 60 y.o.   MRN: [id number]    Subjective: Pt feeling better.  No complaints.  Tolerating catheter well.  Objective: Vital signs in last 24 hours: Temp:  [97.6 F (36.4 C)-98.8 F (37.1 C)] 98.8 F (37.1 C) (02/25 0541) Pulse Rate:  [78-96] 78 (02/25 0541) Resp:  [18-20] 18 (02/25 0541) BP: (111-135)/(71-83) 135/83 mmHg (02/25 0541) SpO2:  [98 %-100 %] 100 % (02/25 0541)  Intake/Output from previous day: 02/24 0701 - 02/25 0700 In: 2808.8 [P.O.:240; I.V.:2268.8; IV Piggyback:300] Out: 1625 [Urine:1625] Intake/Output this shift: Total I/O In: 1925 [I.V.:1625; IV Piggyback:300] Out: 575 [Urine:575]  Physical Exam:  General: Alert and oriented Abdomen: Inc C/D/I without drainage, Erythema in SP region below pubis (improved per patient) and without significant tenderness GU: Catheter in place draining well with mostly clear urine  Lab Results:  Recent Labs  09/08/15 1116 09/09/15 0539 09/10/15 0528  HGB 14.5 11.4* 10.6*  HCT 39.9 32.1* 29.8*   Lab Results  Component Value Date   WBC 8.7 09/10/2015   HGB 10.6* 09/10/2015   HCT 29.8* 09/10/2015   MCV 83.5 09/10/2015   PLT 363 09/10/2015     BMET  Recent Labs  09/08/15 2034 09/09/15 0539  NA 134* 138  K 3.1* 3.2*  CL 108 112*  CO2 15* 19*  GLUCOSE 144* 114*  BUN 49* 24*  CREATININE 2.98* 0.96  CALCIUM 7.4* 8.1*     Studies/Results: Ct Abdomen Pelvis Wo Contrast  09/08/2015  CLINICAL DATA:  Recent laparoscopy and left salpingo-oophorectomy. The postoperative course is complicated by drainage of fluid from umbilicus, inability to urinate adequately and gross hematuria. EXAM: CT ABDOMEN AND PELVIS WITHOUT CONTRAST TECHNIQUE: Multidetector CT imaging of the abdomen and pelvis was performed following the standard protocol without IV contrast. CT cystogram was performed with 300 cc of Omnipaque/saline diluted by a Foley catheter. COMPARISON:  May 18, 2015  FINDINGS: There are small low-density lesions in the liver unchanged compared prior exam. These are probably cysts. The gallbladder is normal. The biliary tree is normal. The spleen and pancreas are normal. The adrenal glands are normal. The kidneys are normal. There is no hydronephrosis bilaterally. The aorta is normal.  There is no abdominal lymphadenopathy. Contrast opacified bladder is noted. There is large extravasation of contrast with a air in the anterior pelvis and lower abdomen. The colon is normal.  There is no small bowel obstruction. The uterus is stable with probable uterine fibroid unchanged. Small amount of ascites is noted in the abdomen and pelvis. There is atelectasis of the lung bases unchanged. Degenerative joint changes of the spine are noted. IMPRESSION: Large extravasation of contrast and air from the bladder into the anterior pelvis and lower abdomen suggesting extraperitoneal bladder tear. Electronically Signed   By: Sherian Rein M.D.   On: 09/08/2015 14:42    Assessment/Plan: Extraperitoneal bladder injury  - Renal function normalized - Continue catheter drainage - Ok for discharge with catheter from a urologic standpoint - Prophylactic Bactrim SS once per day recommended until cystogram - Dr. Mena Goes to arrange f/u in 2-3 weeks as outpatient for cystogram and catheter removal if bladder injury resolved - Please call if further questions   LOS: 2 days   Miles Borkowski,LES 09/10/2015, 6:20 AM

## 2015-09-10 NOTE — Progress Notes (Signed)
Pt discharged to home with brother and mother.  Discharge instructions reviewed with all three together.  Pt home with Foley catheter attached to leg strap.  Additional items sent home with patient include leg bag, new leg strap, and mesh underwear.  Reviewed instructions for emptying both bags, changing between bags, and cleaning catheter and bags.  Pt elected to go home with catheter attached to large drainage bag and will switch to leg bag if desired. Pt performed return demonstration of above skills.  Pt to car via wheelchair with RN.  No other equipment besides above ordered for home at discharge.

## 2015-09-10 NOTE — Progress Notes (Signed)
Feels good Ambulating, tolerating regular diet well Ready to go home  Filed Vitals:   09/09/15 2304 09/10/15 0541  BP: 130/78 135/83  Pulse: 83 78  Temp: 97.8 F (36.6 C) 98.8 F (37.1 C)  Resp: 19 18   Abdomen soft, incisions OK Mild erythema of vulva, soft  A/P: D/C home         Bactrim qd with cath in place         FU with Dr Vincente Poli as scheduled         FU with Dr Mena Goes 2-3 weeks for cath removal

## 2015-10-11 ENCOUNTER — Other Ambulatory Visit (HOSPITAL_COMMUNITY): Payer: Self-pay | Admitting: Obstetrics and Gynecology

## 2015-10-11 DIAGNOSIS — R1011 Right upper quadrant pain: Secondary | ICD-10-CM

## 2015-10-24 ENCOUNTER — Ambulatory Visit (HOSPITAL_COMMUNITY)
Admission: RE | Admit: 2015-10-24 | Discharge: 2015-10-24 | Disposition: A | Discharge status: Home / Self Care | Payer: 59 | Source: Ambulatory Visit | Attending: Obstetrics and Gynecology | Admitting: Obstetrics and Gynecology

## 2015-10-24 DIAGNOSIS — R1011 Right upper quadrant pain: Secondary | ICD-10-CM

## 2015-10-24 DIAGNOSIS — K7689 Other specified diseases of liver: Secondary | ICD-10-CM | POA: Insufficient documentation

## 2020-08-28 ENCOUNTER — Other Ambulatory Visit: Payer: Self-pay

## 2020-08-28 ENCOUNTER — Emergency Department (HOSPITAL_COMMUNITY): Payer: 59

## 2020-08-28 ENCOUNTER — Emergency Department (HOSPITAL_BASED_OUTPATIENT_CLINIC_OR_DEPARTMENT_OTHER): Payer: 59

## 2020-08-28 ENCOUNTER — Emergency Department (HOSPITAL_COMMUNITY)
Admission: EM | Admit: 2020-08-28 | Discharge: 2020-08-28 | Disposition: A | Discharge status: Home / Self Care | Payer: 59 | Attending: Emergency Medicine | Admitting: Emergency Medicine

## 2020-08-28 ENCOUNTER — Encounter (HOSPITAL_COMMUNITY): Payer: Self-pay | Admitting: Emergency Medicine

## 2020-08-28 DIAGNOSIS — M25562 Pain in left knee: Secondary | ICD-10-CM

## 2020-08-28 DIAGNOSIS — X501XXA Overexertion from prolonged static or awkward postures, initial encounter: Secondary | ICD-10-CM | POA: Insufficient documentation

## 2020-08-28 DIAGNOSIS — Y9301 Activity, walking, marching and hiking: Secondary | ICD-10-CM | POA: Insufficient documentation

## 2020-08-28 DIAGNOSIS — W109XXA Fall (on) (from) unspecified stairs and steps, initial encounter: Secondary | ICD-10-CM | POA: Diagnosis not present

## 2020-08-28 MED ORDER — HYDROCODONE-ACETAMINOPHEN 5-325 MG PO TABS: 1.0000 | ORAL_TABLET | ORAL | 0 refills | Status: AC | PRN

## 2020-08-28 MED ORDER — IBUPROFEN 600 MG PO TABS: 600.0000 mg | ORAL_TABLET | Freq: Four times a day (QID) | ORAL | 0 refills | Status: AC | PRN

## 2020-08-28 NOTE — Progress Notes (Signed)
VASCULAR LAB    Left lower extremity venous duplex has been performed.  See CV proc for preliminary results.  Messaged results via secure chat to Dr. Trude Mcburney, [facility], RVT 08/28/2020, 12:06 PM

## 2020-08-28 NOTE — ED Notes (Signed)
States she was walking down some stairs fast on Friday and felt something pop in the back of her left knee. States it was painful on Friday , denies pain today c/o swelling behind her left knee. Positive left pedal pulse. States her left leg is stiff and she isn't able to walk on it.

## 2020-08-28 NOTE — ED Notes (Signed)
Returned from xray

## 2020-08-28 NOTE — ED Notes (Signed)
Transported to xray

## 2020-08-28 NOTE — ED Provider Notes (Signed)
[facility] EMERGENCY DEPARTMENT Provider Note   CSN: [id number] Arrival date & time: 08/28/20  1017     History Chief Complaint  Patient presents with  . Knee Pain    Laura Mays is a 65 y.o. female.  Pt presents to the ED today with left knee pain.  Pt said she was walking down the steps on 2/11 and felt a pop behind her left knee.  She said it is not that painful, but it is stiff and sore and hard to walk.  She is worried it is a blood clot.  No sob or cp.       pmhx: gerd  OB History   No obstetric history on file.    surg hx: Date Unknown Laparoscopy Date Unknown left salpingoophorectomy [Other] Date Unknown Ovarian cyst removal No family history on file.  CAD father  Social History   Tobacco Use  . Smoking status: Never Smoker  . Smokeless tobacco: Never Used  Substance Use Topics  . Alcohol use: Not Currently  . Drug use: Not Currently    Home Medications Prior to Admission medications   Medication Sig Start Date End Date Taking? Authorizing Provider  HYDROcodone-acetaminophen (NORCO/VICODIN) 5-325 MG tablet Take 1 tablet by mouth every 4 (four) hours as needed. 08/28/20  Yes Jacalyn Lefevre, MD  ibuprofen (ADVIL) 600 MG tablet Take 1 tablet (600 mg total) by mouth every 6 (six) hours as needed. 08/28/20  Yes Jacalyn Lefevre, MD   none Allergies    Aspirin  Review of Systems   Review of Systems  Musculoskeletal:       Left knee pain  All other systems reviewed and are negative.   Physical Exam Updated Vital Signs BP 108/69 (BP Location: Left Arm)   Pulse 78   Temp 98 F (36.7 C) (Oral)   Resp 16   SpO2 99%   Physical Exam Vitals and nursing note reviewed.  Constitutional:      Appearance: Normal appearance.  HENT:     Head: Normocephalic and atraumatic.     Right Ear: External ear normal.     Left Ear: External ear normal.     Nose: Nose normal.     Mouth/Throat:     Mouth: Mucous membranes are moist.      Pharynx: Oropharynx is clear.  Eyes:     Extraocular Movements: Extraocular movements intact.     Conjunctiva/sclera: Conjunctivae normal.     Pupils: Pupils are equal, round, and reactive to light.  Cardiovascular:     Rate and Rhythm: Normal rate and regular rhythm.     Pulses: Normal pulses.     Heart sounds: Normal heart sounds.  Pulmonary:     Effort: Pulmonary effort is normal.     Breath sounds: Normal breath sounds.  Abdominal:     General: Abdomen is flat. Bowel sounds are normal.     Palpations: Abdomen is soft.  Musculoskeletal:     Cervical back: Normal range of motion and neck supple.       Legs:  Skin:    General: Skin is warm.     Capillary Refill: Capillary refill takes less than 2 seconds.  Neurological:     General: No focal deficit present.     Mental Status: She is alert and oriented to person, place, and time.  Psychiatric:        Mood and Affect: Mood normal.        Behavior: Behavior normal.  Thought Content: Thought content normal.        Judgment: Judgment normal.     ED Results / Procedures / Treatments   Labs (all labs ordered are listed, but only abnormal results are displayed) Labs Reviewed - No data to display  EKG None  Radiology DG Knee Complete 4 Views Left  Result Date: 08/28/2020 CLINICAL DATA:  Posterior knee swelling after feeling a pop walking downstairs on Friday. EXAM: LEFT KNEE - COMPLETE 4+ VIEW COMPARISON:  Left knee x-rays dated October 25, 2016. FINDINGS: No acute fracture or dislocation. Small joint effusion. Joint spaces are preserved. Bone mineralization is normal. Soft tissues are unremarkable. IMPRESSION: 1. Small joint effusion. No acute osseous abnormality or significant degenerative changes. Electronically Signed   By: Obie Dredge M.D.   On: 08/28/2020 11:04   VAS [place] LOWER EXTREMITY VENOUS (DVT) (ONLY [facility] & [facility])  Result Date: 08/28/2020  Lower Venous DVT Study Indications: Knee pain.  Limitations: Patein  grabbing tech's hand during compressions, holding breath. Comparison Study: No prior study on file Performing Technologist: Sherren Kerns RVS  Examination Guidelines: A complete evaluation includes B-mode imaging, spectral Doppler, color Doppler, and power Doppler as needed of all accessible portions of each vessel. Bilateral testing is considered an integral part of a complete examination. Limited examinations for reoccurring indications may be performed as noted. The reflux portion of the exam is performed with the patient in reverse Trendelenburg.  +-----+---------------+---------+-----------+----------+--------------+ RIGHTCompressibilityPhasicitySpontaneityPropertiesThrombus Aging +-----+---------------+---------+-----------+----------+--------------+ CFV  Full           Yes      Yes                                 +-----+---------------+---------+-----------+----------+--------------+   +---------+---------------+---------+-----------+----------+-------------------+ LEFT     CompressibilityPhasicitySpontaneityPropertiesThrombus Aging      +---------+---------------+---------+-----------+----------+-------------------+ CFV      Full           Yes      Yes                                      +---------+---------------+---------+-----------+----------+-------------------+ SFJ      Full                                                             +---------+---------------+---------+-----------+----------+-------------------+ FV Prox  Full                                                             +---------+---------------+---------+-----------+----------+-------------------+ FV Mid   Full                                                             +---------+---------------+---------+-----------+----------+-------------------+ FV Distal               Yes  Yes                  patent by color and                                                        Doppler             +---------+---------------+---------+-----------+----------+-------------------+ PFV      Full                                                             +---------+---------------+---------+-----------+----------+-------------------+ POP      Full           Yes      Yes                                      +---------+---------------+---------+-----------+----------+-------------------+ PTV      Full                                                             +---------+---------------+---------+-----------+----------+-------------------+ PERO     Full                                                             +---------+---------------+---------+-----------+----------+-------------------+     Summary: RIGHT: - No evidence of common femoral vein obstruction.  LEFT: - There is no evidence of deep vein thrombosis in the lower extremity. However, portions of this examination were limited- see technologist comments above.  - No cystic structure found in the popliteal fossa.  *See table(s) above for measurements and observations.    Preliminary     Procedures Procedures   Medications Ordered in ED Medications - No data to display  ED Course  I have reviewed the triage vital signs and the nursing notes.  Pertinent labs & imaging results that were available during my care of the patient were reviewed by me and considered in my medical decision making (see chart for details).    MDM Rules/Calculators/A&P                          No DVT or fx.  I suspect a hamstring strain or tear.  Pt is placed in a knee immobilizer and instructed to f/u with ortho.  She has a walker at home.  She knows to return if worse.  Final Clinical Impression(s) / ED Diagnoses Final diagnoses:  Acute pain of left knee    Rx / DC Orders ED Discharge Orders         Ordered    HYDROcodone-acetaminophen (NORCO/VICODIN) 5-325 MG tablet  Every 4 hours PRN  08/28/20  1222    ibuprofen (ADVIL) 600 MG tablet  Every 6 hours PRN        08/28/20 1222           Jacalyn LefevreHaviland, Annesha Delgreco, MD 08/28/20 1223

## 2020-08-28 NOTE — ED Triage Notes (Signed)
Pt states she heard her L knee pop on Friday.  No pain with sitting.  Pain 5/10 with ambulating.  Reports swelling to posterior knee.

## 2020-09-02 ENCOUNTER — Ambulatory Visit: Payer: 59 | Admitting: Orthopaedic Surgery

## 2022-01-20 IMAGING — CR DG KNEE COMPLETE 4+V*L*
4 series · 4 of 4 positions shown · non-contrast
Comparison: Left knee x-rays dated October 25, 2016.

CLINICAL DATA: Posterior knee swelling after feeling a pop walking
downstairs on [REDACTED].

EXAM:
LEFT KNEE - COMPLETE 4+ VIEW

[knee ap]
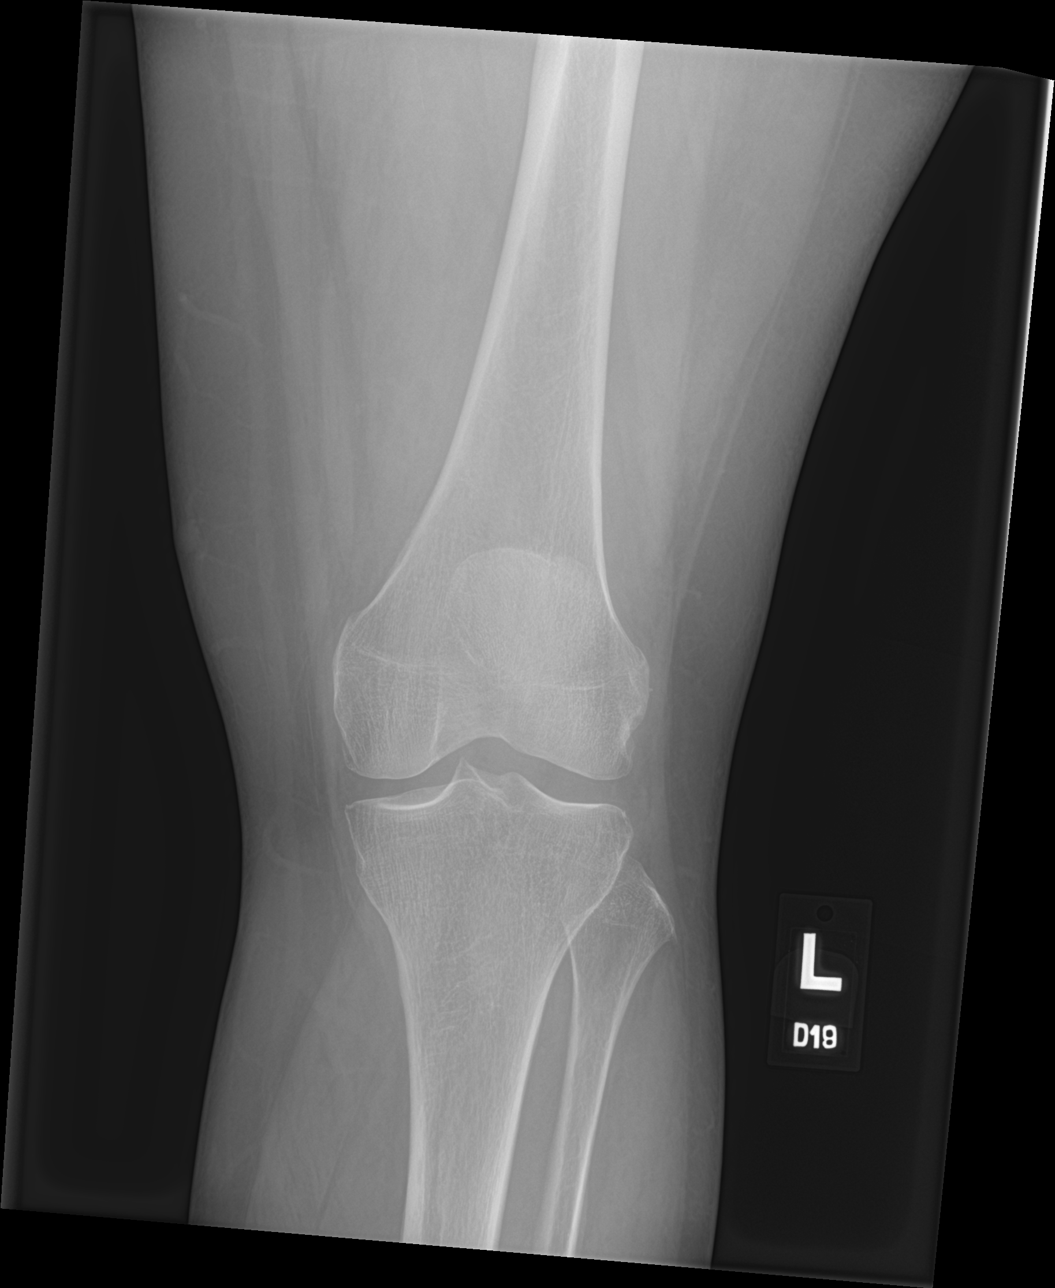

[knee lat]
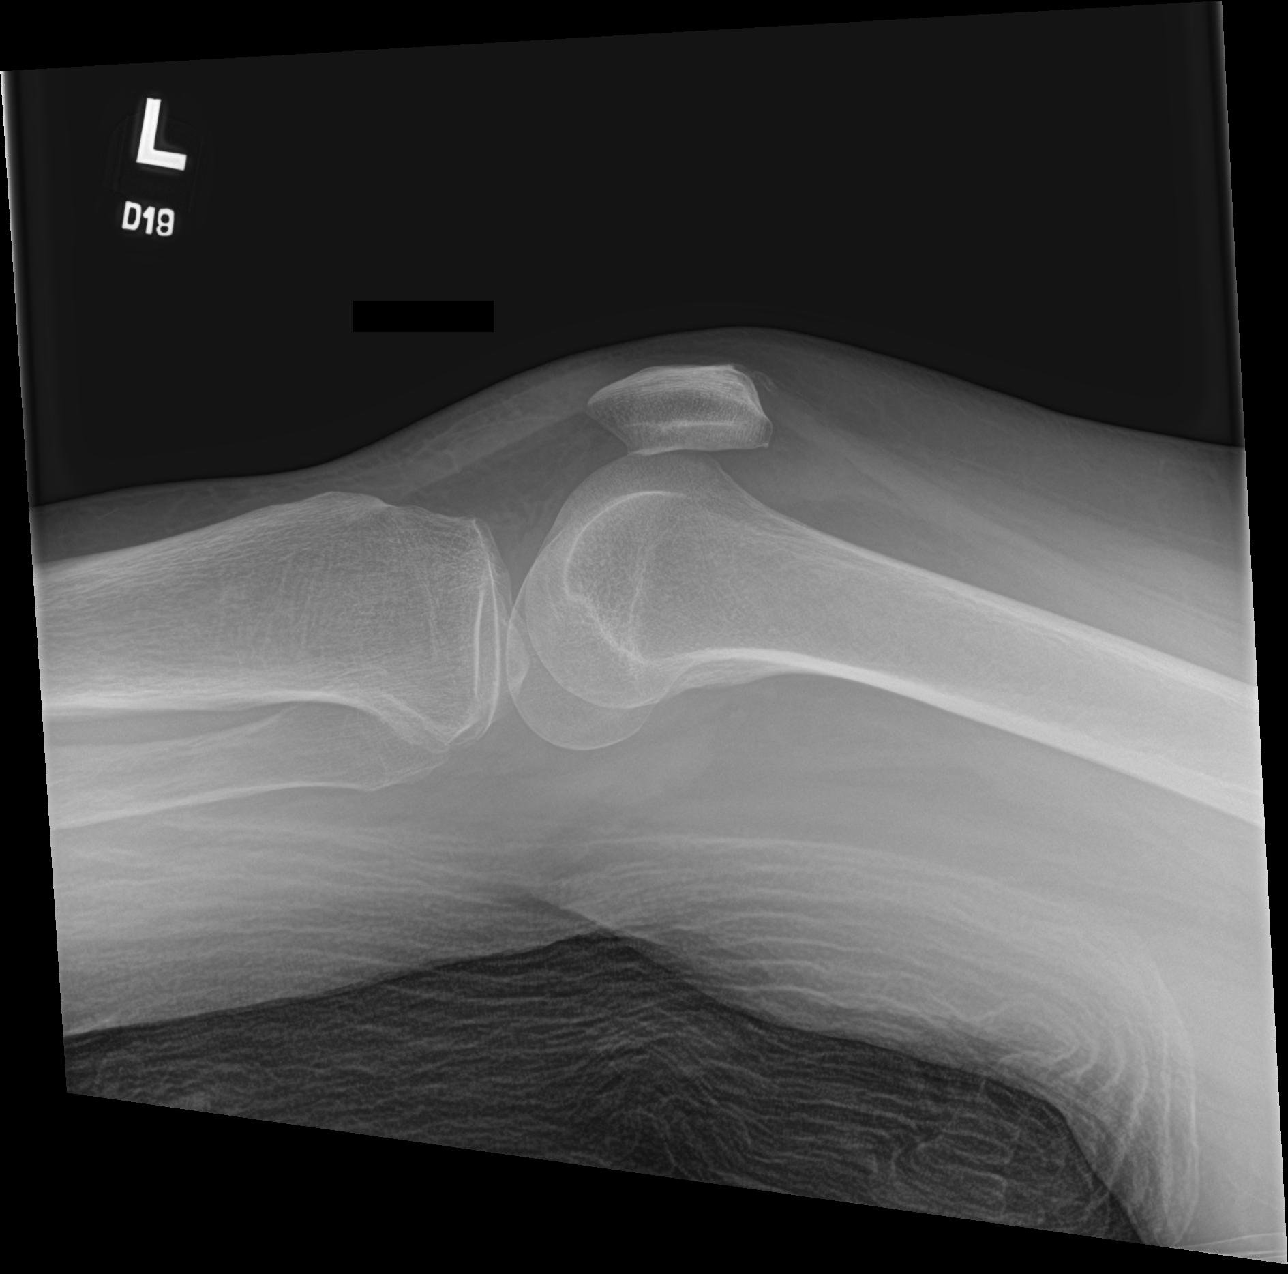

[knee obl (1 of 2)]
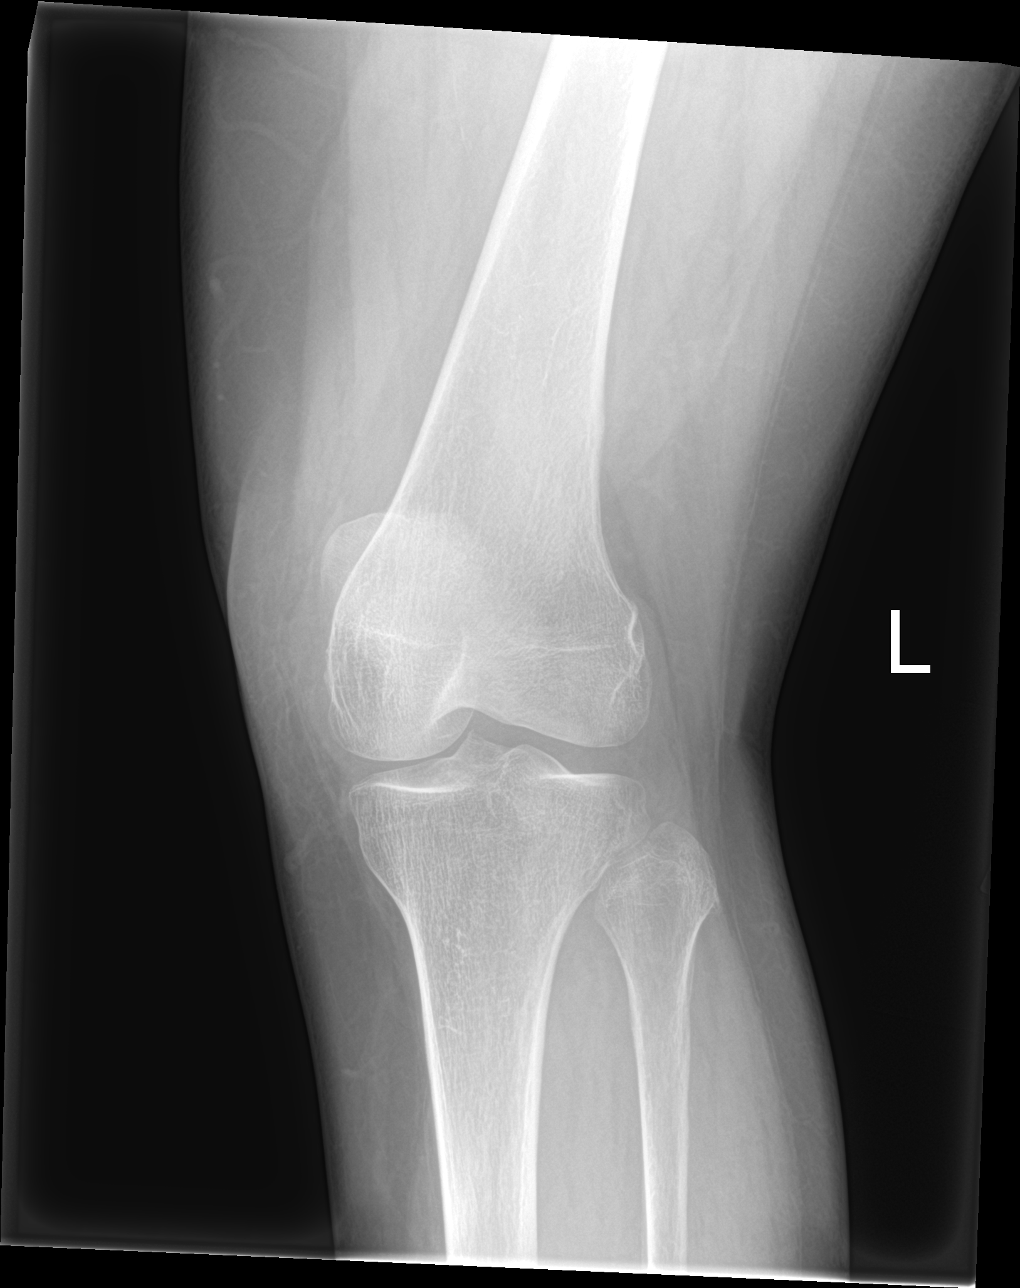

[knee obl (2 of 2)]
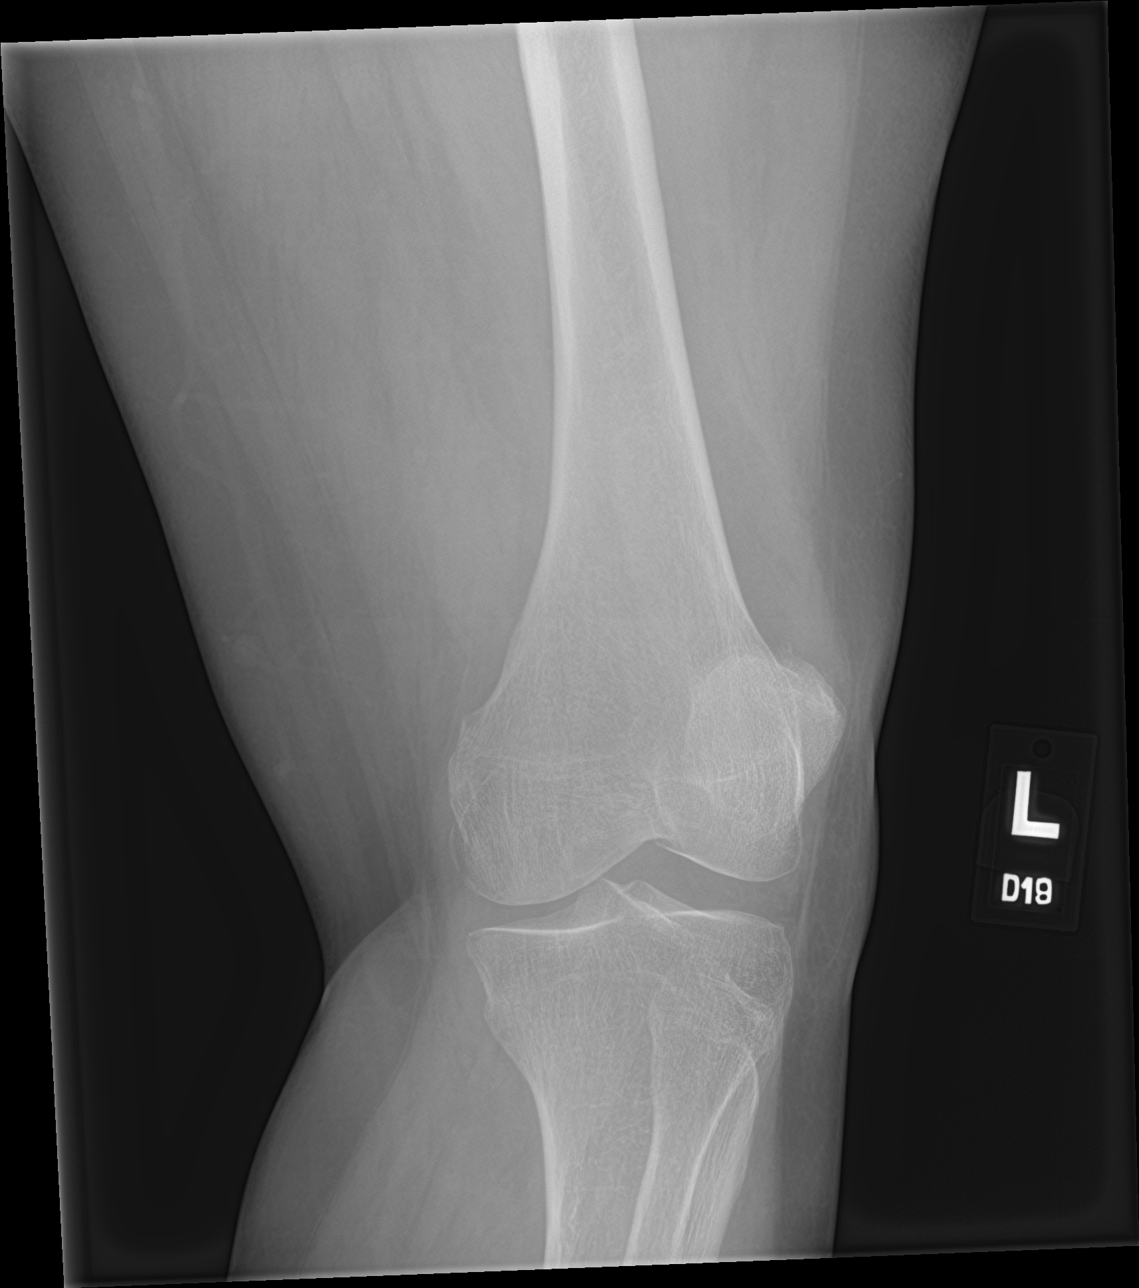

[4 of 4 positions shown; findings below may reference images not displayed]

FINDINGS: No acute fracture or dislocation. Small joint effusion. Joint spaces
are preserved. Bone mineralization is normal. Soft tissues are
unremarkable.
IMPRESSION: 1. Small joint effusion. No acute osseous abnormality or significant
degenerative changes.

## 2023-05-25 ENCOUNTER — Emergency Department (HOSPITAL_COMMUNITY)
Admission: EM | Admit: 2023-05-25 | Discharge: 2023-05-25 | Disposition: A | Discharge status: Home / Self Care | Payer: Medicare Other | Attending: Emergency Medicine | Admitting: Emergency Medicine

## 2023-05-25 ENCOUNTER — Emergency Department (HOSPITAL_COMMUNITY): Payer: Medicare Other

## 2023-05-25 ENCOUNTER — Encounter (HOSPITAL_COMMUNITY): Payer: Self-pay | Admitting: Emergency Medicine

## 2023-05-25 ENCOUNTER — Other Ambulatory Visit: Payer: Self-pay

## 2023-05-25 DIAGNOSIS — R202 Paresthesia of skin: Secondary | ICD-10-CM | POA: Diagnosis not present

## 2023-05-25 DIAGNOSIS — R519 Headache, unspecified: Secondary | ICD-10-CM | POA: Insufficient documentation

## 2023-05-25 NOTE — ED Provider Notes (Signed)
[facility]MENT AT [facility] Provider Note   CSN: [id number] Arrival date & time: 05/25/23  1631     History  Chief Complaint  Patient presents with   Numbness    Laura Mays is a 67 y.o. female.  HPI   Patient has a history of acid reflux, prior quadriceps injury.  Patient presents to the ED for evaluation of an episode of brief sharp headache on the left side of her head associated with some tingling in her left arm.  Patient states she had an episode yesterday.  It lasted a few minutes.  However then she experienced some tingling on the left side of her face as well as her left upper arm.  Patient was not having any trouble with weakness.  She has not noticed any difficulty with her speech or vision.  The headache resolved on its own and she has not taken any medications for it.  Patient states she does have history of migraines and occasionally gets sinus headaches.  She was able to work today without any difficulty but was concerned about the possibility of having a stroke so she came to the ED for evaluation.  Home Medications Prior to Admission medications   Medication Sig Start Date End Date Taking? Authorizing Provider  acetaminophen (TYLENOL) 500 MG tablet Take 1,000 mg by mouth every 6 (six) hours as needed for mild pain or moderate pain.    [provider]  HYDROcodone-acetaminophen (NORCO/VICODIN) 5-325 MG tablet Take 1 tablet by mouth every 4 (four) hours as needed. 08/28/20   Jacalyn Lefevre, MD  ibuprofen (ADVIL) 600 MG tablet Take 1 tablet (600 mg total) by mouth every 6 (six) hours as needed. 08/28/20   Jacalyn Lefevre, MD  nystatin-triamcinolone ointment [facility]) Apply 1 application topically 2 (two) times daily. 09/10/15   Harold Hedge, MD  sulfamethoxazole-trimethoprim (BACTRIM) 400-80 MG tablet Take 1 tablet by mouth daily. 09/10/15   Harold Hedge, MD      Allergies    Aspirin and Aspirin    Review of Systems   Review  of Systems  Physical Exam Updated Vital Signs BP (!) 136/98 (BP Location: Left Arm)   Pulse 77   Temp 98.5 F (36.9 C) (Oral)   Resp 18   Ht 1.499 m (4\' 11" )   Wt 65.8 kg   SpO2 97%   BMI 29.29 kg/m  Physical Exam Vitals and nursing note reviewed.  Constitutional:      General: She is not in acute distress.    Appearance: She is well-developed.  HENT:     Head: Normocephalic and atraumatic.     Right Ear: External ear normal.     Left Ear: External ear normal.  Eyes:     General: No visual field deficit or scleral icterus.       Right eye: No discharge.        Left eye: No discharge.     Conjunctiva/sclera: Conjunctivae normal.  Neck:     Trachea: No tracheal deviation.  Cardiovascular:     Rate and Rhythm: Normal rate and regular rhythm.  Pulmonary:     Effort: Pulmonary effort is normal. No respiratory distress.     Breath sounds: Normal breath sounds. No stridor. No wheezing or rales.  Abdominal:     General: Bowel sounds are normal. There is no distension.     Palpations: Abdomen is soft.     Tenderness: There is no abdominal tenderness. There is no guarding or  rebound.  Musculoskeletal:        General: No tenderness or deformity.     Cervical back: Neck supple.  Skin:    General: Skin is warm and dry.     Findings: No rash.  Neurological:     General: No focal deficit present.     Mental Status: She is alert and oriented to person, place, and time.     Cranial Nerves: No cranial nerve deficit, dysarthria or facial asymmetry.     Sensory: No sensory deficit.     Motor: No abnormal muscle tone, seizure activity or pronator drift.     Coordination: Coordination normal.     Comments:  able to hold both legs off bed for 5 seconds, sensation intact in all extremities,  no left or right sided neglect, normal finger-nose exam bilaterally, no nystagmus noted   Psychiatric:        Mood and Affect: Mood normal.     ED Results / Procedures / Treatments    Labs (all labs ordered are listed, but only abnormal results are displayed) Labs Reviewed  CBC WITH DIFFERENTIAL/PLATELET  BASIC METABOLIC PANEL  CBC    EKG None  Radiology CT Head Wo Contrast  Result Date: 05/25/2023 CLINICAL DATA:  Transient ischemic attack (TIA) EXAM: CT HEAD WITHOUT CONTRAST TECHNIQUE: Contiguous axial images were obtained from the base of the skull through the vertex without intravenous contrast. RADIATION DOSE REDUCTION: This exam was performed according to the departmental dose-optimization program which includes automated exposure control, adjustment of the mA and/or kV according to patient size and/or use of iterative reconstruction technique. COMPARISON:  None Available. FINDINGS: Brain: No evidence of large-territorial acute infarction. No parenchymal hemorrhage. No mass lesion. No extra-axial collection. No mass effect or midline shift. No hydrocephalus. Basilar cisterns are patent. Vascular: No hyperdense vessel. Skull: No acute fracture or focal lesion. Sinuses/Orbits: Paranasal sinuses and mastoid air cells are clear. The orbits are unremarkable. Other: None. IMPRESSION: No acute intracranial abnormality. Electronically Signed   By: Tish Frederickson M.D.   On: 05/25/2023 20:23    Procedures Procedures    Medications Ordered in ED Medications - No data to display  ED Course/ Medical Decision Making/ A&P Clinical Course as of 05/25/23 2223  Sat May 25, 2023  2140 Head CT without acute abnormalities [JK]    Clinical Course User Index [JK] Linwood Dibbles, MD       ABCD2 Score: 2                         Medical Decision Making  Patient presented to the ED for complaints of a headache as well as some paresthesias.  Considered the possibility of stroke, TIA.  Overall suspicion is low.  Patient has normal exam.  CT scan did not show any acute abnormality.  Do not feel based on her presentation MRI is necessary at this time.  Patient is comfortable in no  distress.  Does not require any medications.  Patient does have history of migraines.  It is possible this was a migraine type headache.  Gwyndolyn Kaufman test were ordered at triage however was initially with her lab results and they were not done.  By the time I saw the patient she had been waiting for several hours.  She did not want to wait to have her laboratory tests were drawn.  Her exam is reassuring and at this point I think it is reasonable.  She will follow-up with  PCP.        Final Clinical Impression(s) / ED Diagnoses Final diagnoses:  Acute nonintractable headache, unspecified headache type  Paresthesia    Rx / DC Orders ED Discharge Orders     None         Linwood Dibbles, MD 05/25/23 2223

## 2023-05-25 NOTE — ED Triage Notes (Signed)
Patient arrives ambulatory by POV states yesterday (unsure of what time) was playing with her dog on the ground and had a sharp pain to left side of head. States she laid down and noticed left side of face and left arm feel tingly. States her left eye feels heavy and tired.

## 2023-05-25 NOTE — Discharge Instructions (Addendum)
The CT scan did not show any acute abnormality.  Follow-up with your primary care doctor as we discussed.  Return to the ER for any recurrent or concerning symptoms

## 2023-05-25 NOTE — ED Provider Triage Note (Signed)
Emergency Medicine Provider Triage Evaluation Note  Laura Mays , a 67 y.o. female  was evaluated in triage.  Pt complains of Numbness to left side of face as well as headache.  History of migraines.  No weakness..  Symptom onset sometime yesterday.  Review of Systems  Positive: As above Negative: As above  Physical Exam  BP (!) 132/98 (BP Location: Left Arm)   Pulse (!) 101   Temp 98.1 F (36.7 C) (Oral)   Resp 18   Ht 4\' 11"  (1.499 m)   Wt 65.8 kg   SpO2 97%   BMI 29.29 kg/m  Gen:   Awake, no distress   Resp:  Normal effort  MSK:   Moves extremities without difficulty  Other:    Medical Decision Making  Medically screening exam initiated at 5:34 PM.  Appropriate orders placed.  Laura Mays was informed that the remainder of the evaluation will be completed by another provider, this initial triage assessment does not replace that evaluation, and the importance of remaining in the ED until their evaluation is complete.     Marita Kansas, PA-C 05/25/23 1736
